# Patient Record
Sex: Female | Born: 1960 | Race: White | Hispanic: No | Marital: Married | Smoking: Never smoker
Health system: Southern US, Community
[De-identification: ages and names within clinical notes are randomized; demographics above are authoritative.]

## PROBLEM LIST (undated history)

## (undated) DIAGNOSIS — K219 Gastro-esophageal reflux disease without esophagitis: Secondary | ICD-10-CM

## (undated) DIAGNOSIS — M1990 Unspecified osteoarthritis, unspecified site: Secondary | ICD-10-CM

## (undated) DIAGNOSIS — E079 Disorder of thyroid, unspecified: Secondary | ICD-10-CM

## (undated) HISTORY — DX: Disorder of thyroid, unspecified: E07.9

## (undated) HISTORY — DX: Unspecified osteoarthritis, unspecified site: M19.90

## (undated) HISTORY — PX: BREAST SURGERY: SHX581

## (undated) HISTORY — DX: Gastro-esophageal reflux disease without esophagitis: K21.9

---

## 2012-11-28 ENCOUNTER — Ambulatory Visit: Payer: Self-pay | Admitting: Internal Medicine

## 2012-11-28 ENCOUNTER — Encounter: Payer: Self-pay | Admitting: Internal Medicine

## 2012-11-28 ENCOUNTER — Ambulatory Visit (INDEPENDENT_AMBULATORY_CARE_PROVIDER_SITE_OTHER): Payer: Managed Care, Other (non HMO) | Admitting: Internal Medicine

## 2012-11-28 VITALS — BP 120/80 | HR 81 | Temp 97.8°F | Ht 66.0 in | Wt 228.0 lb

## 2012-11-28 DIAGNOSIS — Z0000 Encounter for general adult medical examination without abnormal findings: Secondary | ICD-10-CM

## 2012-11-28 DIAGNOSIS — E669 Obesity, unspecified: Secondary | ICD-10-CM

## 2012-11-28 DIAGNOSIS — E039 Hypothyroidism, unspecified: Secondary | ICD-10-CM

## 2012-11-28 DIAGNOSIS — L4050 Arthropathic psoriasis, unspecified: Secondary | ICD-10-CM | POA: Insufficient documentation

## 2012-11-28 LAB — CBC
HCT: 43.4 % (ref 36.0–46.0)
MCHC: 34.4 g/dL (ref 30.0–36.0)
MCV: 92 fl (ref 78.0–100.0)
Platelets: 210 10*3/uL (ref 150.0–400.0)
RBC: 4.72 Mil/uL (ref 3.87–5.11)
RDW: 14.1 % (ref 11.5–14.6)

## 2012-11-28 LAB — COMPREHENSIVE METABOLIC PANEL
ALT: 23 U/L (ref 0–35)
AST: 23 U/L (ref 0–37)
Alkaline Phosphatase: 86 U/L (ref 39–117)
BUN: 13 mg/dL (ref 6–23)
CO2: 27 mEq/L (ref 19–32)
Calcium: 9.3 mg/dL (ref 8.4–10.5)
Chloride: 106 mEq/L (ref 96–112)
Creatinine, Ser: 0.9 mg/dL (ref 0.4–1.2)
Glucose, Bld: 89 mg/dL (ref 70–99)
Potassium: 4.2 mEq/L (ref 3.5–5.1)

## 2012-11-28 LAB — LIPID PANEL
Cholesterol: 189 mg/dL (ref 0–200)
HDL: 38.2 mg/dL — ABNORMAL LOW (ref >39.00)
LDL Cholesterol: 126 mg/dL — ABNORMAL HIGH (ref 0–99)
Total CHOL/HDL Ratio: 5
Triglycerides: 125 mg/dL (ref 0.0–149.0)

## 2012-11-28 LAB — TSH: TSH: 0.74 u[IU]/mL (ref 0.35–5.50)

## 2012-11-28 MED ORDER — FEXOFENADINE HCL 180 MG PO TABS: 180.0000 mg | ORAL_TABLET | Freq: Every day | ORAL | Status: AC

## 2012-11-28 MED ORDER — FLUTICASONE PROPIONATE 50 MCG/ACT NA SUSP: 2.0000 | Freq: Every day | NASAL | Status: DC

## 2012-11-28 MED ORDER — FERROUS SULFATE 325 (65 FE) MG PO TBEC: 325.0000 mg | DELAYED_RELEASE_TABLET | Freq: Every day | ORAL | Status: AC

## 2012-11-28 MED ORDER — LEVOTHYROXINE SODIUM 112 MCG PO TABS: 112.0000 ug | ORAL_TABLET | Freq: Every day | ORAL | Status: DC

## 2012-11-28 MED ORDER — B-12 1000 MCG PO CAPS: 1.0000 | ORAL_CAPSULE | Freq: Every day | ORAL | Status: AC

## 2012-11-28 MED ORDER — CYANOCOBALAMIN 1000 MCG/ML IJ SOLN: 1000.0000 ug | INTRAMUSCULAR | Status: AC

## 2012-11-28 MED ORDER — ERGOCALCIFEROL 1.25 MG (50000 UT) PO CAPS: 50000.0000 [IU] | ORAL_CAPSULE | ORAL | Status: AC

## 2012-11-28 NOTE — Assessment & Plan Note (Signed)
Work on diet and exercise 

## 2012-11-28 NOTE — Assessment & Plan Note (Signed)
Continue Enbrel and continue to follow with rheumatology

## 2012-11-28 NOTE — Patient Instructions (Signed)

## 2012-11-28 NOTE — Progress Notes (Signed)
HPI Pt presents to the clinic today to establish care. She recently moved from [place]. She is transferring care from Dr. Yolanda Manges. Her last physical was 10/2011. She has no concerns today.  Flu: 10/2012 Pneumoniavax: 2000 Tetanus: within last 10 years Pap Smear: 2012 Mammogram: 2012 Eye Doctor: yearly Dentist: biannually  Past Medical History  Diagnosis Date  . Arthritis   . GERD (gastroesophageal reflux disease)   . Thyroid disease     Current Outpatient Prescriptions  Medication Sig Dispense Refill  . cyanocobalamin (,VITAMIN B-12,) 1000 MCG/ML injection Inject 1,000 mcg into the muscle once a week.      . Cyanocobalamin (B-12) 1000 MCG CAPS Take 1 capsule by mouth daily.      . ergocalciferol (VITAMIN D2) 50000 UNITS capsule Take 50,000 Units by mouth 2 (two) times a week.      . etanercept (ENBREL) 50 MG/ML injection Inject 50 mg into the skin once a week.      . ferrous sulfate 325 (65 FE) MG EC tablet Take 325 mg by mouth daily with breakfast.      . fexofenadine (ALLEGRA) 180 MG tablet Take 180 mg by mouth daily.      . fluticasone (FLONASE) 50 MCG/ACT nasal spray Place 2 sprays into both nostrils daily.      Marland Kitchen levothyroxine (SYNTHROID, LEVOTHROID) 112 MCG tablet Take 112 mcg by mouth daily before breakfast.       No current facility-administered medications for this visit.    Allergies  Allergen Reactions  . Sulfa Antibiotics     Family History  Problem Relation Age of Onset  . Hypertension Mother   . COPD Mother   . Birth defects Maternal Aunt     uterine  . Stroke Paternal Uncle     History   Social History  . Marital Status: Married    Spouse Name: N/A    Number of Children: N/A  . Years of Education: N/A   Occupational History  . Not on file.   Social History Main Topics  . Smoking status: Never Smoker   . Smokeless tobacco: Never Used  . Alcohol Use: No  . Drug Use: No  . Sexual Activity: Yes    Birth Control/ Protection: Post-menopausal    Other Topics Concern  . Not on file   Social History Narrative  . No narrative on file    ROS:  Constitutional: Denies fever, malaise, fatigue, headache or abrupt weight changes.  HEENT: Denies eye pain, eye redness, ear pain, ringing in the ears, wax buildup, runny nose, nasal congestion, bloody nose, or sore throat. Respiratory: Denies difficulty breathing, shortness of breath, cough or sputum production.   Cardiovascular: Denies chest pain, chest tightness, palpitations or swelling in the hands or feet.  Gastrointestinal: Denies abdominal pain, bloating, constipation, diarrhea or blood in the stool.  GU: Denies frequency, urgency, pain with urination, blood in urine, odor or discharge. Musculoskeletal: Pt reports multiple joint pain. Denies decrease in range of motion, difficulty with gait, muscle pain Skin: Pt reports psoriasis. Denies redness, lesions or ulcercations.  Neurological: Denies dizziness, difficulty with memory, difficulty with speech or problems with balance and coordination.   No other specific complaints in a complete review of systems (except as listed in HPI above).  PE:  BP 120/80  Pulse 81  Temp(Src) 97.8 F (36.6 C) (Tympanic)  Ht 5\' 6"  (1.676 m)  Wt 228 lb (103.42 kg)  BMI 36.82 kg/m2  SpO2 98% Wt Readings from Last 3 Encounters:  11/28/12 228 lb (103.42 kg)    General: Appears her stated age, obese but well developed, well nourished in NAD. HEENT: Head: normal shape and size; Eyes: sclera white, no icterus, conjunctiva pink, PERRLA and EOMs intact; Ears: Tm's gray and intact, normal light reflex; Nose: mucosa pink and moist, septum midline; Throat/Mouth: Teeth present, mucosa pink and moist, no lesions or ulcerations noted.  Neck: Normal range of motion. Neck supple, trachea midline. No massses, lumps or thyromegaly present.  Cardiovascular: Normal rate and rhythm. S1,S2 noted.  No murmur, rubs or gallops noted. No JVD or BLE edema. No carotid  bruits noted. Pulmonary/Chest: Normal effort and positive vesicular breath sounds. No respiratory distress. No wheezes, rales or ronchi noted.  Abdomen: Soft and nontender. Normal bowel sounds, no bruits noted. No distention or masses noted. Liver, spleen and kidneys non palpable. Musculoskeletal: Normal range of motion. No signs of joint swelling. No difficulty with gait.  Neurological: Alert and oriented. Cranial nerves II-XII intact. Coordination normal. +DTRs bilaterally. Psychiatric: Mood and affect normal. Behavior is normal. Judgment and thought content normal.      Assessment and Plan:  Preventative Health Maintenance:  All HM UTD Will obtain screening labs today Will request records from previous provider Will schedule your mammogram for you RTC for your pap smear whenever is convenient for you.  RTC in 1 year or sooner if needed

## 2012-11-28 NOTE — Assessment & Plan Note (Signed)
Will check TSH today Synthroid refilled today

## 2012-11-29 ENCOUNTER — Encounter: Payer: Self-pay | Admitting: Family Medicine

## 2012-12-11 ENCOUNTER — Ambulatory Visit: Payer: Self-pay | Admitting: Internal Medicine

## 2012-12-25 ENCOUNTER — Encounter: Payer: Self-pay | Admitting: Internal Medicine

## 2012-12-26 ENCOUNTER — Ambulatory Visit: Payer: Self-pay | Admitting: Internal Medicine

## 2012-12-27 ENCOUNTER — Encounter: Payer: Self-pay | Admitting: Internal Medicine

## 2013-01-07 ENCOUNTER — Ambulatory Visit: Payer: Managed Care, Other (non HMO) | Admitting: Internal Medicine

## 2013-05-16 ENCOUNTER — Other Ambulatory Visit: Payer: Self-pay | Admitting: Internal Medicine

## 2013-07-08 ENCOUNTER — Other Ambulatory Visit: Payer: Self-pay | Admitting: Internal Medicine

## 2013-10-07 ENCOUNTER — Other Ambulatory Visit: Payer: Self-pay

## 2013-10-07 MED ORDER — LEVOTHYROXINE SODIUM 112 MCG PO TABS: 112.0000 ug | ORAL_TABLET | Freq: Every day | ORAL | Status: DC

## 2013-12-03 ENCOUNTER — Encounter: Payer: Managed Care, Other (non HMO) | Admitting: Internal Medicine

## 2013-12-05 ENCOUNTER — Ambulatory Visit (INDEPENDENT_AMBULATORY_CARE_PROVIDER_SITE_OTHER)
Admission: RE | Admit: 2013-12-05 | Discharge: 2013-12-05 | Disposition: A | Discharge status: Home / Self Care | Payer: Private Health Insurance - Indemnity | Source: Ambulatory Visit | Attending: Internal Medicine | Admitting: Internal Medicine

## 2013-12-05 ENCOUNTER — Ambulatory Visit (INDEPENDENT_AMBULATORY_CARE_PROVIDER_SITE_OTHER): Payer: Private Health Insurance - Indemnity | Admitting: Internal Medicine

## 2013-12-05 ENCOUNTER — Encounter: Payer: Self-pay | Admitting: Internal Medicine

## 2013-12-05 VITALS — BP 142/84 | HR 88 | Temp 98.0°F | Ht 65.25 in | Wt 234.0 lb

## 2013-12-05 DIAGNOSIS — M545 Low back pain, unspecified: Secondary | ICD-10-CM

## 2013-12-05 DIAGNOSIS — I10 Essential (primary) hypertension: Secondary | ICD-10-CM | POA: Insufficient documentation

## 2013-12-05 DIAGNOSIS — Z0000 Encounter for general adult medical examination without abnormal findings: Secondary | ICD-10-CM

## 2013-12-05 DIAGNOSIS — E669 Obesity, unspecified: Secondary | ICD-10-CM

## 2013-12-05 DIAGNOSIS — R928 Other abnormal and inconclusive findings on diagnostic imaging of breast: Secondary | ICD-10-CM

## 2013-12-05 MED ORDER — HYDROCHLOROTHIAZIDE 12.5 MG PO CAPS: 12.5000 mg | ORAL_CAPSULE | Freq: Every day | ORAL | Status: DC

## 2013-12-05 NOTE — Progress Notes (Signed)
Pre visit review using our clinic review tool, if applicable. No additional management support is needed unless otherwise documented below in the visit note. 

## 2013-12-05 NOTE — Patient Instructions (Signed)
Preventive Care for Adults A healthy lifestyle and preventive care can promote health and wellness. Preventive health guidelines for women include the following key practices.  A routine yearly physical is a good way to check with your health care provider about your health and preventive screening. It is a chance to share any concerns and updates on your health and to receive a thorough exam.  Visit your dentist for a routine exam and preventive care every 6 months. Brush your teeth twice a day and floss once a day. Good oral hygiene prevents tooth decay and gum disease.  The frequency of eye exams is based on your age, health, family medical history, use of contact lenses, and other factors. Follow your health care provider's recommendations for frequency of eye exams.  Eat a healthy diet. Foods like vegetables, fruits, whole grains, low-fat dairy products, and lean protein foods contain the nutrients you need without too many calories. Decrease your intake of foods high in solid fats, added sugars, and salt. Eat the right amount of calories for you.Get information about a proper diet from your health care provider, if necessary.  Regular physical exercise is one of the most important things you can do for your health. Most adults should get at least 150 minutes of moderate-intensity exercise (any activity that increases your heart rate and causes you to sweat) each week. In addition, most adults need muscle-strengthening exercises on 2 or more days a week.  Maintain a healthy weight. The body mass index (BMI) is a screening tool to identify possible weight problems. It provides an estimate of body fat based on height and weight. Your health care provider can find your BMI and can help you achieve or maintain a healthy weight.For adults 20 years and older:  A BMI below 18.5 is considered underweight.  A BMI of 18.5 to 24.9 is normal.  A BMI of 25 to 29.9 is considered overweight.  A BMI of  30 and above is considered obese.  Maintain normal blood lipids and cholesterol levels by exercising and minimizing your intake of saturated fat. Eat a balanced diet with plenty of fruit and vegetables. Blood tests for lipids and cholesterol should begin at age 76 and be repeated every 5 years. If your lipid or cholesterol levels are high, you are over 50, or you are at high risk for heart disease, you may need your cholesterol levels checked more frequently.Ongoing high lipid and cholesterol levels should be treated with medicines if diet and exercise are not working.  If you smoke, find out from your health care provider how to quit. If you do not use tobacco, do not start.  Lung cancer screening is recommended for adults aged 22-80 years who are at high risk for developing lung cancer because of a history of smoking. A yearly low-dose CT scan of the lungs is recommended for people who have at least a 30-pack-year history of smoking and are a current smoker or have quit within the past 15 years. A pack year of smoking is smoking an average of 1 pack of cigarettes a day for 1 year (for example: 1 pack a day for 30 years or 2 packs a day for 15 years). Yearly screening should continue until the smoker has stopped smoking for at least 15 years. Yearly screening should be stopped for people who develop a health problem that would prevent them from having lung cancer treatment.  If you are pregnant, do not drink alcohol. If you are breastfeeding,  be very cautious about drinking alcohol. If you are not pregnant and choose to drink alcohol, do not have more than 1 drink per day. One drink is considered to be 12 ounces (355 mL) of beer, 5 ounces (148 mL) of wine, or 1.5 ounces (44 mL) of liquor.  Avoid use of street drugs. Do not share needles with anyone. Ask for help if you need support or instructions about stopping the use of drugs.  High blood pressure causes heart disease and increases the risk of  stroke. Your blood pressure should be checked at least every 1 to 2 years. Ongoing high blood pressure should be treated with medicines if weight loss and exercise do not work.  If you are 3-86 years old, ask your health care provider if you should take aspirin to prevent strokes.  Diabetes screening involves taking a blood sample to check your fasting blood sugar level. This should be done once every 3 years, after age 67, if you are within normal weight and without risk factors for diabetes. Testing should be considered at a younger age or be carried out more frequently if you are overweight and have at least 1 risk factor for diabetes.  Breast cancer screening is essential preventive care for women. You should practice "breast self-awareness." This means understanding the normal appearance and feel of your breasts and may include breast self-examination. Any changes detected, no matter how small, should be reported to a health care provider. Women in their 8s and 30s should have a clinical breast exam (CBE) by a health care provider as part of a regular health exam every 1 to 3 years. After age 70, women should have a CBE every year. Starting at age 25, women should consider having a mammogram (breast X-ray test) every year. Women who have a family history of breast cancer should talk to their health care provider about genetic screening. Women at a high risk of breast cancer should talk to their health care providers about having an MRI and a mammogram every year.  Breast cancer gene (BRCA)-related cancer risk assessment is recommended for women who have family members with BRCA-related cancers. BRCA-related cancers include breast, ovarian, tubal, and peritoneal cancers. Having family members with these cancers may be associated with an increased risk for harmful changes (mutations) in the breast cancer genes BRCA1 and BRCA2. Results of the assessment will determine the need for genetic counseling and  BRCA1 and BRCA2 testing.  Routine pelvic exams to screen for cancer are no longer recommended for nonpregnant women who are considered low risk for cancer of the pelvic organs (ovaries, uterus, and vagina) and who do not have symptoms. Ask your health care provider if a screening pelvic exam is right for you.  If you have had past treatment for cervical cancer or a condition that could lead to cancer, you need Pap tests and screening for cancer for at least 20 years after your treatment. If Pap tests have been discontinued, your risk factors (such as having a new sexual partner) need to be reassessed to determine if screening should be resumed. Some women have medical problems that increase the chance of getting cervical cancer. In these cases, your health care provider may recommend more frequent screening and Pap tests.  The HPV test is an additional test that may be used for cervical cancer screening. The HPV test looks for the virus that can cause the cell changes on the cervix. The cells collected during the Pap test can be  tested for HPV. The HPV test could be used to screen women aged 30 years and older, and should be used in women of any age who have unclear Pap test results. After the age of 30, women should have HPV testing at the same frequency as a Pap test.  Colorectal cancer can be detected and often prevented. Most routine colorectal cancer screening begins at the age of 50 years and continues through age 75 years. However, your health care provider may recommend screening at an earlier age if you have risk factors for colon cancer. On a yearly basis, your health care provider may provide home test kits to check for hidden blood in the stool. Use of a small camera at the end of a tube, to directly examine the colon (sigmoidoscopy or colonoscopy), can detect the earliest forms of colorectal cancer. Talk to your health care provider about this at age 50, when routine screening begins. Direct  exam of the colon should be repeated every 5-10 years through age 75 years, unless early forms of pre-cancerous polyps or small growths are found.  People who are at an increased risk for hepatitis B should be screened for this virus. You are considered at high risk for hepatitis B if:  You were born in a country where hepatitis B occurs often. Talk with your health care provider about which countries are considered high risk.  Your parents were born in a high-risk country and you have not received a shot to protect against hepatitis B (hepatitis B vaccine).  You have HIV or AIDS.  You use needles to inject street drugs.  You live with, or have sex with, someone who has hepatitis B.  You get hemodialysis treatment.  You take certain medicines for conditions like cancer, organ transplantation, and autoimmune conditions.  Hepatitis C blood testing is recommended for all people born from 1945 through 1965 and any individual with known risks for hepatitis C.  Practice safe sex. Use condoms and avoid high-risk sexual practices to reduce the spread of sexually transmitted infections (STIs). STIs include gonorrhea, chlamydia, syphilis, trichomonas, herpes, HPV, and human immunodeficiency virus (HIV). Herpes, HIV, and HPV are viral illnesses that have no cure. They can result in disability, cancer, and death.  You should be screened for sexually transmitted illnesses (STIs) including gonorrhea and chlamydia if:  You are sexually active and are younger than 24 years.  You are older than 24 years and your health care provider tells you that you are at risk for this type of infection.  Your sexual activity has changed since you were last screened and you are at an increased risk for chlamydia or gonorrhea. Ask your health care provider if you are at risk.  If you are at risk of being infected with HIV, it is recommended that you take a prescription medicine daily to prevent HIV infection. This is  called preexposure prophylaxis (PrEP). You are considered at risk if:  You are a heterosexual woman, are sexually active, and are at increased risk for HIV infection.  You take drugs by injection.  You are sexually active with a partner who has HIV.  Talk with your health care provider about whether you are at high risk of being infected with HIV. If you choose to begin PrEP, you should first be tested for HIV. You should then be tested every 3 months for as long as you are taking PrEP.  Osteoporosis is a disease in which the bones lose minerals and strength   with aging. This can result in serious bone fractures or breaks. The risk of osteoporosis can be identified using a bone density scan. Women ages 65 years and over and women at risk for fractures or osteoporosis should discuss screening with their health care providers. Ask your health care provider whether you should take a calcium supplement or vitamin D to reduce the rate of osteoporosis.  Menopause can be associated with physical symptoms and risks. Hormone replacement therapy is available to decrease symptoms and risks. You should talk to your health care provider about whether hormone replacement therapy is right for you.  Use sunscreen. Apply sunscreen liberally and repeatedly throughout the day. You should seek shade when your shadow is shorter than you. Protect yourself by wearing long sleeves, pants, a wide-brimmed hat, and sunglasses year round, whenever you are outdoors.  Once a month, do a whole body skin exam, using a mirror to look at the skin on your back. Tell your health care provider of new moles, moles that have irregular borders, moles that are larger than a pencil eraser, or moles that have changed in shape or color.  Stay current with required vaccines (immunizations).  Influenza vaccine. All adults should be immunized every year.  Tetanus, diphtheria, and acellular pertussis (Td, Tdap) vaccine. Pregnant women should  receive 1 dose of Tdap vaccine during each pregnancy. The dose should be obtained regardless of the length of time since the last dose. Immunization is preferred during the 27th-36th week of gestation. An adult who has not previously received Tdap or who does not know her vaccine status should receive 1 dose of Tdap. This initial dose should be followed by tetanus and diphtheria toxoids (Td) booster doses every 10 years. Adults with an unknown or incomplete history of completing a 3-dose immunization series with Td-containing vaccines should begin or complete a primary immunization series including a Tdap dose. Adults should receive a Td booster every 10 years.  Varicella vaccine. An adult without evidence of immunity to varicella should receive 2 doses or a second dose if she has previously received 1 dose. Pregnant females who do not have evidence of immunity should receive the first dose after pregnancy. This first dose should be obtained before leaving the health care facility. The second dose should be obtained 4-8 weeks after the first dose.  Human papillomavirus (HPV) vaccine. Females aged 13-26 years who have not received the vaccine previously should obtain the 3-dose series. The vaccine is not recommended for use in pregnant females. However, pregnancy testing is not needed before receiving a dose. If a female is found to be pregnant after receiving a dose, no treatment is needed. In that case, the remaining doses should be delayed until after the pregnancy. Immunization is recommended for any person with an immunocompromised condition through the age of 26 years if she did not get any or all doses earlier. During the 3-dose series, the second dose should be obtained 4-8 weeks after the first dose. The third dose should be obtained 24 weeks after the first dose and 16 weeks after the second dose.  Zoster vaccine. One dose is recommended for adults aged 60 years or older unless certain conditions are  present.  Measles, mumps, and rubella (MMR) vaccine. Adults born before 1957 generally are considered immune to measles and mumps. Adults born in 1957 or later should have 1 or more doses of MMR vaccine unless there is a contraindication to the vaccine or there is laboratory evidence of immunity to   each of the three diseases. A routine second dose of MMR vaccine should be obtained at least 28 days after the first dose for students attending postsecondary schools, health care workers, or international travelers. People who received inactivated measles vaccine or an unknown type of measles vaccine during 1963-1967 should receive 2 doses of MMR vaccine. People who received inactivated mumps vaccine or an unknown type of mumps vaccine before 1979 and are at high risk for mumps infection should consider immunization with 2 doses of MMR vaccine. For females of childbearing age, rubella immunity should be determined. If there is no evidence of immunity, females who are not pregnant should be vaccinated. If there is no evidence of immunity, females who are pregnant should delay immunization until after pregnancy. Unvaccinated health care workers born before 1957 who lack laboratory evidence of measles, mumps, or rubella immunity or laboratory confirmation of disease should consider measles and mumps immunization with 2 doses of MMR vaccine or rubella immunization with 1 dose of MMR vaccine.  Pneumococcal 13-valent conjugate (PCV13) vaccine. When indicated, a person who is uncertain of her immunization history and has no record of immunization should receive the PCV13 vaccine. An adult aged 19 years or older who has certain medical conditions and has not been previously immunized should receive 1 dose of PCV13 vaccine. This PCV13 should be followed with a dose of pneumococcal polysaccharide (PPSV23) vaccine. The PPSV23 vaccine dose should be obtained at least 8 weeks after the dose of PCV13 vaccine. An adult aged 19  years or older who has certain medical conditions and previously received 1 or more doses of PPSV23 vaccine should receive 1 dose of PCV13. The PCV13 vaccine dose should be obtained 1 or more years after the last PPSV23 vaccine dose.  Pneumococcal polysaccharide (PPSV23) vaccine. When PCV13 is also indicated, PCV13 should be obtained first. All adults aged 65 years and older should be immunized. An adult younger than age 65 years who has certain medical conditions should be immunized. Any person who resides in a nursing home or long-term care facility should be immunized. An adult smoker should be immunized. People with an immunocompromised condition and certain other conditions should receive both PCV13 and PPSV23 vaccines. People with human immunodeficiency virus (HIV) infection should be immunized as soon as possible after diagnosis. Immunization during chemotherapy or radiation therapy should be avoided. Routine use of PPSV23 vaccine is not recommended for American Indians, [place] Natives, or people younger than 65 years unless there are medical conditions that require PPSV23 vaccine. When indicated, people who have unknown immunization and have no record of immunization should receive PPSV23 vaccine. One-time revaccination 5 years after the first dose of PPSV23 is recommended for people aged 19-64 years who have chronic kidney failure, nephrotic syndrome, asplenia, or immunocompromised conditions. People who received 1-2 doses of PPSV23 before age 65 years should receive another dose of PPSV23 vaccine at age 65 years or later if at least 5 years have passed since the previous dose. Doses of PPSV23 are not needed for people immunized with PPSV23 at or after age 65 years.  Meningococcal vaccine. Adults with asplenia or persistent complement component deficiencies should receive 2 doses of quadrivalent meningococcal conjugate (MenACWY-D) vaccine. The doses should be obtained at least 2 months apart.  Microbiologists working with certain meningococcal bacteria, military recruits, people at risk during an outbreak, and people who travel to or live in countries with a high rate of meningitis should be immunized. A first-year college student up through age   21 years who is living in a residence hall should receive a dose if she did not receive a dose on or after her 16th birthday. Adults who have certain high-risk conditions should receive one or more doses of vaccine.  Hepatitis A vaccine. Adults who wish to be protected from this disease, have certain high-risk conditions, work with hepatitis A-infected animals, work in hepatitis A research labs, or travel to or work in countries with a high rate of hepatitis A should be immunized. Adults who were previously unvaccinated and who anticipate close contact with an international adoptee during the first 60 days after arrival in the [place] from a country with a high rate of hepatitis A should be immunized.  Hepatitis B vaccine. Adults who wish to be protected from this disease, have certain high-risk conditions, may be exposed to blood or other infectious body fluids, are household contacts or sex partners of hepatitis B positive people, are clients or workers in certain care facilities, or travel to or work in countries with a high rate of hepatitis B should be immunized.  Haemophilus influenzae type b (Hib) vaccine. A previously unvaccinated person with asplenia or sickle cell disease or having a scheduled splenectomy should receive 1 dose of Hib vaccine. Regardless of previous immunization, a recipient of a hematopoietic stem cell transplant should receive a 3-dose series 6-12 months after her successful transplant. Hib vaccine is not recommended for adults with HIV infection. Preventive Services / Frequency Ages 64 to 68 years  Blood pressure check.** / Every 1 to 2 years.  Lipid and cholesterol check.** / Every 5 years beginning at age  22.  Clinical breast exam.** / Every 3 years for women in their 88s and 53s.  BRCA-related cancer risk assessment.** / For women who have family members with a BRCA-related cancer (breast, ovarian, tubal, or peritoneal cancers).  Pap test.** / Every 2 years from ages 90 through 51. Every 3 years starting at age 21 through age 56 or 3 with a history of 3 consecutive normal Pap tests.  HPV screening.** / Every 3 years from ages 24 through ages 1 to 46 with a history of 3 consecutive normal Pap tests.  Hepatitis C blood test.** / For any individual with known risks for hepatitis C.  Skin self-exam. / Monthly.  Influenza vaccine. / Every year.  Tetanus, diphtheria, and acellular pertussis (Tdap, Td) vaccine.** / Consult your health care provider. Pregnant women should receive 1 dose of Tdap vaccine during each pregnancy. 1 dose of Td every 10 years.  Varicella vaccine.** / Consult your health care provider. Pregnant females who do not have evidence of immunity should receive the first dose after pregnancy.  HPV vaccine. / 3 doses over 6 months, if 72 and younger. The vaccine is not recommended for use in pregnant females. However, pregnancy testing is not needed before receiving a dose.  Measles, mumps, rubella (MMR) vaccine.** / You need at least 1 dose of MMR if you were born in 1957 or later. You may also need a 2nd dose. For females of childbearing age, rubella immunity should be determined. If there is no evidence of immunity, females who are not pregnant should be vaccinated. If there is no evidence of immunity, females who are pregnant should delay immunization until after pregnancy.  Pneumococcal 13-valent conjugate (PCV13) vaccine.** / Consult your health care provider.  Pneumococcal polysaccharide (PPSV23) vaccine.** / 1 to 2 doses if you smoke cigarettes or if you have certain conditions.  Meningococcal vaccine.** /  1 dose if you are age 19 to 21 years and a first-year college  student living in a residence hall, or have one of several medical conditions, you need to get vaccinated against meningococcal disease. You may also need additional booster doses.  Hepatitis A vaccine.** / Consult your health care provider.  Hepatitis B vaccine.** / Consult your health care provider.  Haemophilus influenzae type b (Hib) vaccine.** / Consult your health care provider. Ages 40 to 64 years  Blood pressure check.** / Every 1 to 2 years.  Lipid and cholesterol check.** / Every 5 years beginning at age 20 years.  Lung cancer screening. / Every year if you are aged 55-80 years and have a 30-pack-year history of smoking and currently smoke or have quit within the past 15 years. Yearly screening is stopped once you have quit smoking for at least 15 years or develop a health problem that would prevent you from having lung cancer treatment.  Clinical breast exam.** / Every year after age 40 years.  BRCA-related cancer risk assessment.** / For women who have family members with a BRCA-related cancer (breast, ovarian, tubal, or peritoneal cancers).  Mammogram.** / Every year beginning at age 40 years and continuing for as long as you are in good health. Consult with your health care provider.  Pap test.** / Every 3 years starting at age 30 years through age 65 or 70 years with a history of 3 consecutive normal Pap tests.  HPV screening.** / Every 3 years from ages 30 years through ages 65 to 70 years with a history of 3 consecutive normal Pap tests.  Fecal occult blood test (FOBT) of stool. / Every year beginning at age 50 years and continuing until age 75 years. You may not need to do this test if you get a colonoscopy every 10 years.  Flexible sigmoidoscopy or colonoscopy.** / Every 5 years for a flexible sigmoidoscopy or every 10 years for a colonoscopy beginning at age 50 years and continuing until age 75 years.  Hepatitis C blood test.** / For all people born from 1945 through  1965 and any individual with known risks for hepatitis C.  Skin self-exam. / Monthly.  Influenza vaccine. / Every year.  Tetanus, diphtheria, and acellular pertussis (Tdap/Td) vaccine.** / Consult your health care provider. Pregnant women should receive 1 dose of Tdap vaccine during each pregnancy. 1 dose of Td every 10 years.  Varicella vaccine.** / Consult your health care provider. Pregnant females who do not have evidence of immunity should receive the first dose after pregnancy.  Zoster vaccine.** / 1 dose for adults aged 60 years or older.  Measles, mumps, rubella (MMR) vaccine.** / You need at least 1 dose of MMR if you were born in 1957 or later. You may also need a 2nd dose. For females of childbearing age, rubella immunity should be determined. If there is no evidence of immunity, females who are not pregnant should be vaccinated. If there is no evidence of immunity, females who are pregnant should delay immunization until after pregnancy.  Pneumococcal 13-valent conjugate (PCV13) vaccine.** / Consult your health care provider.  Pneumococcal polysaccharide (PPSV23) vaccine.** / 1 to 2 doses if you smoke cigarettes or if you have certain conditions.  Meningococcal vaccine.** / Consult your health care provider.  Hepatitis A vaccine.** / Consult your health care provider.  Hepatitis B vaccine.** / Consult your health care provider.  Haemophilus influenzae type b (Hib) vaccine.** / Consult your health care provider. Ages 65   years and over  Blood pressure check.** / Every 1 to 2 years.  Lipid and cholesterol check.** / Every 5 years beginning at age 22 years.  Lung cancer screening. / Every year if you are aged 73-80 years and have a 30-pack-year history of smoking and currently smoke or have quit within the past 15 years. Yearly screening is stopped once you have quit smoking for at least 15 years or develop a health problem that would prevent you from having lung cancer  treatment.  Clinical breast exam.** / Every year after age 4 years.  BRCA-related cancer risk assessment.** / For women who have family members with a BRCA-related cancer (breast, ovarian, tubal, or peritoneal cancers).  Mammogram.** / Every year beginning at age 40 years and continuing for as long as you are in good health. Consult with your health care provider.  Pap test.** / Every 3 years starting at age 9 years through age 34 or 91 years with 3 consecutive normal Pap tests. Testing can be stopped between 65 and 70 years with 3 consecutive normal Pap tests and no abnormal Pap or HPV tests in the past 10 years.  HPV screening.** / Every 3 years from ages 57 years through ages 64 or 45 years with a history of 3 consecutive normal Pap tests. Testing can be stopped between 65 and 70 years with 3 consecutive normal Pap tests and no abnormal Pap or HPV tests in the past 10 years.  Fecal occult blood test (FOBT) of stool. / Every year beginning at age 15 years and continuing until age 17 years. You may not need to do this test if you get a colonoscopy every 10 years.  Flexible sigmoidoscopy or colonoscopy.** / Every 5 years for a flexible sigmoidoscopy or every 10 years for a colonoscopy beginning at age 86 years and continuing until age 71 years.  Hepatitis C blood test.** / For all people born from [birth year] through 1964 and any individual with known risks for hepatitis C.  Osteoporosis screening.** / A one-time screening for women ages 83 years and over and women at risk for fractures or osteoporosis.  Skin self-exam. / Monthly.  Influenza vaccine. / Every year.  Tetanus, diphtheria, and acellular pertussis (Tdap/Td) vaccine.** / 1 dose of Td every 10 years.  Varicella vaccine.** / Consult your health care provider.  Zoster vaccine.** / 1 dose for adults aged 61 years or older.  Pneumococcal 13-valent conjugate (PCV13) vaccine.** / Consult your health care provider.  Pneumococcal  polysaccharide (PPSV23) vaccine.** / 1 dose for all adults aged 28 years and older.  Meningococcal vaccine.** / Consult your health care provider.  Hepatitis A vaccine.** / Consult your health care provider.  Hepatitis B vaccine.** / Consult your health care provider.  Haemophilus influenzae type b (Hib) vaccine.** / Consult your health care provider. ** Family history and personal history of risk and conditions may change your health care provider's recommendations. Document Released: 03/07/2001 Document Revised: 05/26/2013 Document Reviewed: 06/06/2010 [facility] Patient Information 2015 [place], [place]. This information is not intended to replace advice given to you by your health care provider. Make sure you discuss any questions you have with your health care provider.

## 2013-12-05 NOTE — Progress Notes (Signed)
Subjective:    Patient ID: Molly Cummings, female    DOB: 08-25-1960, 53 y.o.   MRN: [id number]  HPI  Pt presents to the clinic today to for her annual physical.  She does reports having some blood pressure issues. The highest it has been in 156/96. She reports that it normal runs 140/90's. BP today is 142/84, but she reports she is asymptomatic.  She reports that she is past due for her 6 month follow up from her mammogram and ultrasound. She request a new order today. She has a few nodular densities in the left breast that are being observed.  She has gained 6 lbs. She does not consume and specific diet and does not exercise.  Flu: yearly, wants one today Tetanus: < 10 years ago Pap Smear:  2013 Mammogram: 04/2013 Colon Screening: 2008 Vision Screening: 2015 Dentist: bianually  Review of Systems      Past Medical History  Diagnosis Date  . Arthritis   . GERD (gastroesophageal reflux disease)   . Thyroid disease     Current Outpatient Prescriptions  Medication Sig Dispense Refill  . cyanocobalamin (,VITAMIN B-12,) 1000 MCG/ML injection Inject 1 mL (1,000 mcg total) into the muscle once a week. 30 mL 1  . Cyanocobalamin (B-12) 1000 MCG CAPS Take 1 capsule by mouth daily. 90 capsule 3  . ergocalciferol (VITAMIN D2) 50000 UNITS capsule Take 1 capsule (50,000 Units total) by mouth 2 (two) times a week. 4 capsule 4  . etanercept (ENBREL) 50 MG/ML injection Inject 50 mg into the skin once a week.    . ferrous sulfate 325 (65 FE) MG EC tablet Take 1 tablet (325 mg total) by mouth daily with breakfast. 90 tablet 3  . fexofenadine (ALLEGRA) 180 MG tablet Take 1 tablet (180 mg total) by mouth daily. 90 tablet 3  . fluticasone (FLONASE) 50 MCG/ACT nasal spray USE 2 SPRAYS IN EACH NOSTRIL ONCE A DAY 16 g 4  . levothyroxine (SYNTHROID, LEVOTHROID) 112 MCG tablet Take 1 tablet (112 mcg total) by mouth daily before breakfast. 90 tablet 0   No current facility-administered medications for  this visit.    Allergies  Allergen Reactions  . Sulfa Antibiotics     Family History  Problem Relation Age of Onset  . Hypertension Mother   . COPD Mother   . Birth defects Maternal Aunt     uterine  . Stroke Paternal Uncle     History   Social History  . Marital Status: Married    Spouse Name: N/A    Number of Children: N/A  . Years of Education: N/A   Occupational History  . Not on file.   Social History Main Topics  . Smoking status: Never Smoker   . Smokeless tobacco: Never Used  . Alcohol Use: No  . Drug Use: No  . Sexual Activity: Yes    Birth Control/ Protection: Post-menopausal   Other Topics Concern  . Not on file   Social History Narrative     Constitutional: Pt reports fatigue. Denies fever, malaise, headache or abrupt weight changes.  HEENT: Denies eye pain, eye redness, ear pain, ringing in the ears, wax buildup, runny nose, nasal congestion, bloody nose, or sore throat. Respiratory: Denies difficulty breathing, shortness of breath, cough or sputum production.   Cardiovascular: Denies chest pain, chest tightness, palpitations or swelling in the hands or feet.  Gastrointestinal: Denies abdominal pain, bloating, constipation, diarrhea or blood in the stool.  GU: Denies urgency, frequency,  pain with urination, burning sensation, blood in urine, odor or discharge. Musculoskeletal: Pt reports low back pain. Denies difficulty with gait, muscle pain or joint  swelling.  Skin: Pt reports psoriasis. Denies redness, lesions or ulcercations.  Neurological: Denies dizziness, difficulty with memory, difficulty with speech or problems with balance and coordination.   No other specific complaints in a complete review of systems (except as listed in HPI above).  Objective:   Physical Exam   BP 142/84 mmHg  Pulse 88  Temp(Src) 98 F (36.7 C) (Oral)  Ht 5' 5.25" (1.657 m)  Wt 234 lb (106.142 kg)  BMI 38.66 kg/m2  SpO2 98% Wt Readings from Last 3  Encounters:  12/05/13 234 lb (106.142 kg)  11/28/12 228 lb (103.42 kg)    General: Appears her stated age, obese but well developed, well nourished in NAD. Skin: Warm, dry and intact. Psoriasis noted on elbows. HEENT: Head: normal shape and size; Eyes: sclera white, no icterus, conjunctiva pink, PERRLA and EOMs intact; Ears: Tm's gray and intact, normal light reflex; Nose: mucosa pink and moist, septum midline; Throat/Mouth: Teeth present, mucosa pink and moist, no exudate, lesions or ulcerations noted.  Neck: Neck supple, trachea midline. No masses, lumps or thyromegaly present.  Cardiovascular: Normal rate and rhythm. S1,S2 noted.  No murmur, rubs or gallops noted. No JVD or BLE edema. No carotid bruits noted. Pulmonary/Chest: Normal effort and positive vesicular breath sounds. No respiratory distress. No wheezes, rales or ronchi noted.  Abdomen: Soft and nontender. Normal bowel sounds, no bruits noted. No distention or masses noted. Liver, spleen and kidneys non palpable. Musculoskeletal: Decreased flexion of the lumbar spine. Normal extension and rotation. Tender to palpation over the lumbar spine.  No difficulty with gait. Strength 5/5 BLE. Neurological: Alert and oriented. Cranial nerves II-XII grossly intact. Coordination normal.  Psychiatric: Mood and affect normal. Behavior is normal. Judgment and thought content normal.     BMET    Component Value Date/Time   NA 139 11/28/2012 1123   K 4.2 11/28/2012 1123   CL 106 11/28/2012 1123   CO2 27 11/28/2012 1123   GLUCOSE 89 11/28/2012 1123   BUN 13 11/28/2012 1123   CREATININE 0.9 11/28/2012 1123   CALCIUM 9.3 11/28/2012 1123    Lipid Panel     Component Value Date/Time   CHOL 189 11/28/2012 1123   TRIG 125.0 11/28/2012 1123   HDL 38.20* 11/28/2012 1123   CHOLHDL 5 11/28/2012 1123   VLDL 25.0 11/28/2012 1123   LDLCALC 126* 11/28/2012 1123    CBC    Component Value Date/Time   WBC 6.9 11/28/2012 1123   RBC 4.72  11/28/2012 1123   HGB 14.9 11/28/2012 1123   HCT 43.4 11/28/2012 1123   PLT 210.0 11/28/2012 1123   MCV 92.0 11/28/2012 1123   MCHC 34.4 11/28/2012 1123   RDW 14.1 11/28/2012 1123    Hgb A1C No results found for: HGBA1C      Assessment & Plan:   Preventative Health Maintenance:  Will order repeat diagnostic mammogram with left breast ultrasound- set up with [facility] on your way out Flu shot today Pap smear due 2018 Colon Screening due 2018 Will check CBC, CMET, TSH, T4, Lipid and B12 today  Low back pain:  Will check xray of lumbar spine Try a heating pad and Aleve for now Stretching may also be helpful

## 2013-12-05 NOTE — Assessment & Plan Note (Signed)
Will start HCTZ  RTC in 1 month to recheck BP

## 2013-12-05 NOTE — Assessment & Plan Note (Signed)
Encouraged her to work on diet and exercise 

## 2013-12-06 LAB — VITAMIN B12: Vitamin B-12: >2000 pg/mL — ABNORMAL HIGH (ref 211–911)

## 2013-12-06 LAB — COMPREHENSIVE METABOLIC PANEL
ALT: 27 U/L (ref 0–35)
AST: 23 U/L (ref 0–37)
Albumin: 3.9 g/dL (ref 3.5–5.2)
Alkaline Phosphatase: 81 U/L (ref 39–117)
BILIRUBIN TOTAL: 0.6 mg/dL (ref 0.2–1.2)
BUN: 11 mg/dL (ref 6–23)
CHLORIDE: 104 meq/L (ref 96–112)
CO2: 24 mEq/L (ref 19–32)
CREATININE: 0.99 mg/dL (ref 0.50–1.10)
Calcium: 9 mg/dL (ref 8.4–10.5)
GLUCOSE: 97 mg/dL (ref 70–99)
Potassium: 3.9 mEq/L (ref 3.5–5.3)
Sodium: 140 mEq/L (ref 135–145)
TOTAL PROTEIN: 7.4 g/dL (ref 6.0–8.3)

## 2013-12-06 LAB — CBC
HCT: 42.9 % (ref 36.0–46.0)
HEMOGLOBIN: 15.1 g/dL — AB (ref 12.0–15.0)
MCH: 31.5 pg (ref 26.0–34.0)
MCHC: 35.2 g/dL (ref 30.0–36.0)
MCV: 89.6 fL (ref 78.0–100.0)
Platelets: 219 10*3/uL (ref 150–400)
RBC: 4.79 MIL/uL (ref 3.87–5.11)
RDW: 14 % (ref 11.5–15.5)
WBC: 7.5 10*3/uL (ref 4.0–10.5)

## 2013-12-06 LAB — LIPID PANEL
Cholesterol: 176 mg/dL (ref 0–200)
HDL: 44 mg/dL (ref >39)
LDL CALC: 115 mg/dL — AB (ref 0–99)
TRIGLYCERIDES: 85 mg/dL (ref <150)
Total CHOL/HDL Ratio: 4 Ratio
VLDL: 17 mg/dL (ref 0–40)

## 2013-12-06 LAB — TSH: TSH: 1.927 u[IU]/mL (ref 0.350–4.500)

## 2013-12-06 LAB — T4, FREE: FREE T4: 1.26 ng/dL (ref 0.80–1.80)

## 2013-12-15 ENCOUNTER — Telehealth: Payer: Self-pay | Admitting: Internal Medicine

## 2013-12-15 NOTE — Telephone Encounter (Signed)
It does not completely absorb between the time she gets her injection and 10 minutes later when she has her lab drawn. It is still way to high. She should stop  B12, we can recheck levels in 1 month, if low can restart

## 2013-12-15 NOTE — Telephone Encounter (Signed)
Pt wanted to let you know her b-12 levels were really high b/c she just received her b-12 vaccine

## 2013-12-16 ENCOUNTER — Telehealth: Payer: Self-pay | Admitting: Internal Medicine

## 2013-12-16 ENCOUNTER — Other Ambulatory Visit: Payer: Self-pay | Admitting: Internal Medicine

## 2013-12-16 DIAGNOSIS — R921 Mammographic calcification found on diagnostic imaging of breast: Secondary | ICD-10-CM

## 2013-12-16 NOTE — Telephone Encounter (Signed)
done

## 2013-12-16 NOTE — Telephone Encounter (Signed)
Please put order for R breast US with dx of calcifications. Pt never returned to have this done from her 12/2012 MM. See report in your inbox.

## 2013-12-16 NOTE — Telephone Encounter (Signed)
Pt is aware as instructed and expressed understanding with stopping B12

## 2014-01-01 ENCOUNTER — Ambulatory Visit: Payer: Self-pay | Admitting: Internal Medicine

## 2014-01-01 ENCOUNTER — Encounter: Payer: Self-pay | Admitting: Internal Medicine

## 2014-01-09 ENCOUNTER — Ambulatory Visit (INDEPENDENT_AMBULATORY_CARE_PROVIDER_SITE_OTHER): Payer: Private Health Insurance - Indemnity | Admitting: Internal Medicine

## 2014-01-09 ENCOUNTER — Encounter: Payer: Self-pay | Admitting: Internal Medicine

## 2014-01-09 VITALS — BP 126/88 | HR 98 | Temp 97.8°F | Wt 233.0 lb

## 2014-01-09 DIAGNOSIS — E039 Hypothyroidism, unspecified: Secondary | ICD-10-CM

## 2014-01-09 DIAGNOSIS — R748 Abnormal levels of other serum enzymes: Secondary | ICD-10-CM

## 2014-01-09 DIAGNOSIS — I10 Essential (primary) hypertension: Secondary | ICD-10-CM

## 2014-01-09 LAB — BASIC METABOLIC PANEL
BUN: 12 mg/dL (ref 6–23)
CALCIUM: 9.4 mg/dL (ref 8.4–10.5)
CO2: 28 mEq/L (ref 19–32)
Chloride: 102 mEq/L (ref 96–112)
Creat: 0.99 mg/dL (ref 0.50–1.10)
Glucose, Bld: 110 mg/dL — ABNORMAL HIGH (ref 70–99)
POTASSIUM: 3.6 meq/L (ref 3.5–5.3)
Sodium: 140 mEq/L (ref 135–145)

## 2014-01-09 LAB — VITAMIN B12: Vitamin B-12: 352 pg/mL (ref 211–911)

## 2014-01-09 MED ORDER — LEVOTHYROXINE SODIUM 112 MCG PO TABS: 112.0000 ug | ORAL_TABLET | Freq: Every day | ORAL | Status: DC

## 2014-01-09 MED ORDER — HYDROCHLOROTHIAZIDE 12.5 MG PO CAPS: 12.5000 mg | ORAL_CAPSULE | Freq: Every day | ORAL | Status: DC

## 2014-01-09 NOTE — Progress Notes (Signed)
Subjective:    Patient ID: Molly Cummings, female    DOB: [birth year], 53 y.o.   MRN: [id number]  HPI  Pt presents to the clinic today for 1 month follow up of HTN. Her BP was elevated at her last visit at 142/84. She was started on HCTZ 12.5 mg daily. She reports she has been taking the medication as directed. She did feel a little dizzy in the beginning but reports that did subside. She has not checked her BP since her last visit. BP today is 126/88  Additionally, she needs her B12 rechecked. Her level was > 2000 at her last visit, we stopped her b12. She has felt a little more fatigued since that time.  She needs a refill of her synthroid. Labs were drawn 11/2013.  Review of Systems      Past Medical History  Diagnosis Date  . Arthritis   . GERD (gastroesophageal reflux disease)   . Thyroid disease     Current Outpatient Prescriptions  Medication Sig Dispense Refill  . cyanocobalamin (,VITAMIN B-12,) 1000 MCG/ML injection Inject 1 mL (1,000 mcg total) into the muscle once a week. 30 mL 1  . Cyanocobalamin (B-12) 1000 MCG CAPS Take 1 capsule by mouth daily. 90 capsule 3  . ergocalciferol (VITAMIN D2) 50000 UNITS capsule Take 1 capsule (50,000 Units total) by mouth 2 (two) times a week. 4 capsule 4  . etanercept (ENBREL) 50 MG/ML injection Inject 50 mg into the skin once a week.    . ferrous sulfate 325 (65 FE) MG EC tablet Take 1 tablet (325 mg total) by mouth daily with breakfast. 90 tablet 3  . fexofenadine (ALLEGRA) 180 MG tablet Take 1 tablet (180 mg total) by mouth daily. 90 tablet 3  . fluticasone (FLONASE) 50 MCG/ACT nasal spray USE 2 SPRAYS IN EACH NOSTRIL ONCE A DAY 16 g 4  . hydrochlorothiazide (MICROZIDE) 12.5 MG capsule Take 1 capsule (12.5 mg total) by mouth daily. 30 capsule 0  . levothyroxine (SYNTHROID, LEVOTHROID) 112 MCG tablet Take 1 tablet (112 mcg total) by mouth daily before breakfast. 90 tablet 0   No current facility-administered medications for this  visit.    Allergies  Allergen Reactions  . Sulfa Antibiotics     Family History  Problem Relation Age of Onset  . Hypertension Mother   . COPD Mother   . Birth defects Maternal Aunt     uterine  . Stroke Paternal Uncle     History   Social History  . Marital Status: Married    Spouse Name: N/A    Number of Children: N/A  . Years of Education: N/A   Occupational History  . Not on file.   Social History Main Topics  . Smoking status: Never Smoker   . Smokeless tobacco: Never Used  . Alcohol Use: No  . Drug Use: No  . Sexual Activity: Yes    Birth Control/ Protection: Post-menopausal   Other Topics Concern  . Not on file   Social History Narrative     Constitutional: Pt reports fatigue. Denies fever, malaise,  headache or abrupt weight changes.  Respiratory: Denies difficulty breathing, shortness of breath, cough or sputum production.   Cardiovascular: Denies chest pain, chest tightness, palpitations or swelling in the hands or feet.  Neurological: Denies dizziness, difficulty with memory, difficulty with speech or problems with balance and coordination.   No other specific complaints in a complete review of systems (except as listed in HPI above).  Objective:   Physical Exam  BP 126/88 mmHg  Pulse 98  Temp(Src) 97.8 F (36.6 C) (Oral)  Wt 233 lb (105.688 kg)  SpO2 98% Wt Readings from Last 3 Encounters:  01/09/14 233 lb (105.688 kg)  12/05/13 234 lb (106.142 kg)  11/28/12 228 lb (103.42 kg)    General: Appears her stated age, obese but well developed, well nourished in NAD. Cardiovascular: Normal rate and rhythm. S1,S2 noted.  No murmur, rubs or gallops noted. No JVD or BLE edema.  Pulmonary/Chest: Normal effort and positive vesicular breath sounds. No respiratory distress. No wheezes, rales or ronchi noted.  Neurological: Alert and oriented.  BMET    Component Value Date/Time   NA 140 12/05/2013 1526   K 3.9 12/05/2013 1526   CL 104 12/05/2013  1526   CO2 24 12/05/2013 1526   GLUCOSE 97 12/05/2013 1526   BUN 11 12/05/2013 1526   CREATININE 0.99 12/05/2013 1526   CREATININE 0.9 11/28/2012 1123   CALCIUM 9.0 12/05/2013 1526    Lipid Panel     Component Value Date/Time   CHOL 176 12/05/2013 1526   TRIG 85 12/05/2013 1526   HDL 44 12/05/2013 1526   CHOLHDL 4.0 12/05/2013 1526   VLDL 17 12/05/2013 1526   LDLCALC 115* 12/05/2013 1526    CBC    Component Value Date/Time   WBC 7.5 12/05/2013 1526   RBC 4.79 12/05/2013 1526   HGB 15.1* 12/05/2013 1526   HCT 42.9 12/05/2013 1526   PLT 219 12/05/2013 1526   MCV 89.6 12/05/2013 1526   MCH 31.5 12/05/2013 1526   MCHC 35.2 12/05/2013 1526   RDW 14.0 12/05/2013 1526    Hgb A1C No results found for: HGBA1C       Assessment & Plan:   Elevated B12 level:  Will repeat b12 today If low, will restart B12 supplements for fatigue  RTC in 6 months for follow up chronic conditions

## 2014-01-09 NOTE — Progress Notes (Signed)
Pre visit review using our clinic review tool, if applicable. No additional management support is needed unless otherwise documented below in the visit note. 

## 2014-01-09 NOTE — Assessment & Plan Note (Signed)
TSH and Free T 4 reviewed Will refill synthroid today

## 2014-01-09 NOTE — Assessment & Plan Note (Signed)
Well controlled on HCTZ Will check CMET today

## 2014-01-09 NOTE — Addendum Note (Signed)
Addended by: Baldomero LamyHAVERS, Donita Newland C on: 01/09/2014 04:37 PM   Modules accepted: Kipp BroodSmartSet

## 2014-01-09 NOTE — Patient Instructions (Addendum)

## 2014-01-12 ENCOUNTER — Telehealth: Payer: Self-pay | Admitting: Internal Medicine

## 2014-01-12 NOTE — Telephone Encounter (Signed)
emmi emailed °

## 2014-03-23 ENCOUNTER — Telehealth: Payer: Self-pay | Admitting: Internal Medicine

## 2014-03-23 NOTE — Telephone Encounter (Signed)
TELEPHONE ADVICE RECORD [facility]  Patient Name: Molly Cummings  DOB: 1960/04/07    Initial Comment Caller states that she hasn't had a period in several year, now she is having dark brown discharge.    Nurse Assessment  Nurse: Odis LusterBowers, RN, Bjorn Loserhonda Date/Time (Eastern Time): 03/23/2014 3:25:45 PM  Confirm and document reason for call. If symptomatic, describe symptoms. ---Caller states that she hasn't had a period in several year, now she is having dark brown discharge. She began having sore breasts, pre period headache with cramps. Last Tuesday, she began to have the discharge, with some mild bright red bleeding. The dark discharge has stopped yesterday.  Has the patient traveled out of the country within the last 30 days? ---No  Does the patient require triage? ---Yes  Related visit to physician within the last 2 weeks? ---No  Does the PT have any chronic conditions? (i.e. diabetes, asthma, etc.) ---Yes  List chronic conditions. ---psoriatic arthritis  Did the patient indicate they were pregnant? ---No     Guidelines    Guideline Title Affirmed Question Affirmed Notes  Menstrual Period - Missed or Late Age > 40 years    Final Disposition User   See PCP within 2 Blima DessertWeeks Bowers, RN, Bjorn Loserhonda    Comments  Caller states missed the nurse's call. Plz try again.  Scheduled caller appt for 03/24/14 @ 9:00 am with Dr. Eugenio Hoesegina Beity. Caller voiced understanding.

## 2014-03-24 ENCOUNTER — Ambulatory Visit (INDEPENDENT_AMBULATORY_CARE_PROVIDER_SITE_OTHER): Payer: Private Health Insurance - Indemnity | Admitting: Internal Medicine

## 2014-03-24 ENCOUNTER — Encounter: Payer: Self-pay | Admitting: Internal Medicine

## 2014-03-24 ENCOUNTER — Other Ambulatory Visit (HOSPITAL_COMMUNITY)
Admission: RE | Admit: 2014-03-24 | Discharge: 2014-03-24 | Disposition: A | Discharge status: Home / Self Care | Payer: Private Health Insurance - Indemnity | Source: Ambulatory Visit | Attending: Internal Medicine | Admitting: Internal Medicine

## 2014-03-24 VITALS — BP 130/88 | HR 88 | Temp 97.9°F | Wt 234.0 lb

## 2014-03-24 DIAGNOSIS — N950 Postmenopausal bleeding: Secondary | ICD-10-CM

## 2014-03-24 DIAGNOSIS — Z01419 Encounter for gynecological examination (general) (routine) without abnormal findings: Secondary | ICD-10-CM | POA: Insufficient documentation

## 2014-03-24 DIAGNOSIS — Z1151 Encounter for screening for human papillomavirus (HPV): Secondary | ICD-10-CM | POA: Diagnosis present

## 2014-03-24 DIAGNOSIS — E538 Deficiency of other specified B group vitamins: Secondary | ICD-10-CM

## 2014-03-24 MED ORDER — CYANOCOBALAMIN 1000 MCG/ML IJ SOLN
1000.0000 ug | Freq: Once | INTRAMUSCULAR | Status: AC
  Administered 2014-03-24: 1000 ug via INTRAMUSCULAR

## 2014-03-24 NOTE — Progress Notes (Signed)
Pre visit review using our clinic review tool, if applicable. No additional management support is needed unless otherwise documented below in the visit note. 

## 2014-03-24 NOTE — Patient Instructions (Signed)
Postmenopausal Bleeding  Postmenopausal bleeding is any bleeding a woman has after she has entered into menopause. Menopause is the end of a woman's fertile years. After menopause, a woman no longer ovulates or has menstrual periods.   Postmenopausal bleeding can be caused by various things. Any type of postmenopausal bleeding, even if it appears to be a typical menstrual period, is concerning. This should be evaluated by your health care provider. Any treatment will depend on the cause of the bleeding.  HOME CARE INSTRUCTIONS  Monitor your condition for any changes. The following actions may help to alleviate any discomfort you are experiencing:  · Avoid the use of tampons and douches as directed by your health care provider.   · Change your pads frequently.  · Get regular pelvic exams and Pap tests.  · Keep all follow-up appointments for diagnostic tests as directed by your health care provider.  SEEK MEDICAL CARE IF:   · Your bleeding lasts more than 1 week.  · You have abdominal pain.  · You have bleeding with sexual intercourse.  SEEK IMMEDIATE MEDICAL CARE IF:   · You have a fever, chills, headache, dizziness, muscle aches, and bleeding.  · You have severe pain with bleeding.  · You are passing blood clots.  · You have bleeding and need more than 1 pad an hour.  · You feel faint.  MAKE SURE YOU:  · Understand these instructions.  · Will watch your condition.  · Will get help right away if you are not doing well or get worse.  Document Released: 04/19/2005 Document Revised: 10/30/2012 Document Reviewed: 08/08/2012  [employer]® Patient Information ©2015 [employer]. This information is not intended to replace advice given to you by your health care provider. Make sure you discuss any questions you have with your health care provider.

## 2014-03-24 NOTE — Progress Notes (Signed)
Subjective:    Patient ID: Molly Cummings, female    DOB: [birth year], 54 y.o.   MRN: [id number]  HPI  Pt presents to the clinic today with c/o vaginal bleeding. She reports this started 1 week ago. She has also had tender breast and cramps in her pelvic area. She reports she noticed thick, dark red spotting x 2-3 days. She has not noticed any bleeding or spotting since yesterday. She has not had a menstrual cycle in the last 3 years. Her last pap was in 2013 and was normal. She is sexually active with 1 partner. She has no family history of breast, uterine, ovarian or cervical cancer.   Review of Systems      Past Medical History  Diagnosis Date  . Arthritis   . GERD (gastroesophageal reflux disease)   . Thyroid disease     Current Outpatient Prescriptions  Medication Sig Dispense Refill  . cyanocobalamin (,VITAMIN B-12,) 1000 MCG/ML injection Inject 1 mL (1,000 mcg total) into the muscle once a week. 30 mL 1  . Cyanocobalamin (B-12) 1000 MCG CAPS Take 1 capsule by mouth daily. 90 capsule 3  . ergocalciferol (VITAMIN D2) 50000 UNITS capsule Take 1 capsule (50,000 Units total) by mouth 2 (two) times a week. 4 capsule 4  . etanercept (ENBREL) 50 MG/ML injection Inject 50 mg into the skin once a week.    . ferrous sulfate 325 (65 FE) MG EC tablet Take 1 tablet (325 mg total) by mouth daily with breakfast. 90 tablet 3  . fexofenadine (ALLEGRA) 180 MG tablet Take 1 tablet (180 mg total) by mouth daily. 90 tablet 3  . fluticasone (FLONASE) 50 MCG/ACT nasal spray USE 2 SPRAYS IN EACH NOSTRIL ONCE A DAY 16 g 4  . hydrochlorothiazide (MICROZIDE) 12.5 MG capsule Take 1 capsule (12.5 mg total) by mouth daily. 90 capsule 1  . levothyroxine (SYNTHROID, LEVOTHROID) 112 MCG tablet Take 1 tablet (112 mcg total) by mouth daily before breakfast. 90 tablet 1   No current facility-administered medications for this visit.    Allergies  Allergen Reactions  . Sulfa Antibiotics     Family History    Problem Relation Age of Onset  . Hypertension Mother   . COPD Mother   . Birth defects Maternal Aunt     uterine  . Stroke Paternal Uncle     History   Social History  . Marital Status: Married    Spouse Name: N/A  . Number of Children: N/A  . Years of Education: N/A   Occupational History  . Not on file.   Social History Main Topics  . Smoking status: Never Smoker   . Smokeless tobacco: Never Used  . Alcohol Use: No  . Drug Use: No  . Sexual Activity: Yes    Birth Control/ Protection: Post-menopausal   Other Topics Concern  . Not on file   Social History Narrative     Constitutional: Denies fever, malaise, fatigue, headache or abrupt weight changes.  Respiratory: Denies difficulty breathing, shortness of breath, cough or sputum production.   Cardiovascular: Denies chest pain, chest tightness, palpitations or swelling in the hands or feet.  Gastrointestinal: Pt reports pelvic cramps. Denies bloating, constipation, diarrhea or blood in the stool.  GU: Pt reports vaginal bleeding. Denies urgency, frequency, pain with urination, burning sensation, blood in urine, odor. Musculoskeletal: Pt report breast tenderness. Denies decrease in range of motion, difficulty with gait, muscle pain or joint pain and swelling.   No other  specific complaints in a complete review of systems (except as listed in HPI above).  Objective:   Physical Exam BP 130/88 mmHg  Pulse 88  Temp(Src) 97.9 F (36.6 C) (Oral)  Wt 234 lb (106.142 kg)  SpO2 98% Wt Readings from Last 3 Encounters:  03/24/14 234 lb (106.142 kg)  01/09/14 233 lb (105.688 kg)  12/05/13 234 lb (106.142 kg)    General: Appears her stated age, obese in NAD. Skin: Warm, dry and intact.  Cardiovascular: Normal rate and rhythm. S1,S2 noted.  No murmur, rubs or gallops noted.  Pulmonary/Chest: Normal effort and positive vesicular breath sounds. No respiratory distress. No wheezes, rales or ronchi noted.  Abdomen: Soft and  nontender. Normal bowel sounds, no bruits noted. No distention or masses noted. Liver, spleen and kidneys non palpable. Pelvic: Normal female anatomy. Cervix without discharge, not friable. Small amount of brown discharge noted below the cervix. No bleeding noted. No CMT. Adnexa non palpable. Neurological: Alert and oriented.   BMET    Component Value Date/Time   NA 140 01/09/2014 1558   K 3.6 01/09/2014 1558   CL 102 01/09/2014 1558   CO2 28 01/09/2014 1558   GLUCOSE 110* 01/09/2014 1558   BUN 12 01/09/2014 1558   CREATININE 0.99 01/09/2014 1558   CREATININE 0.9 11/28/2012 1123   CALCIUM 9.4 01/09/2014 1558    Lipid Panel     Component Value Date/Time   CHOL 176 12/05/2013 1526   TRIG 85 12/05/2013 1526   HDL 44 12/05/2013 1526   CHOLHDL 4.0 12/05/2013 1526   VLDL 17 12/05/2013 1526   LDLCALC 115* 12/05/2013 1526    CBC    Component Value Date/Time   WBC 7.5 12/05/2013 1526   RBC 4.79 12/05/2013 1526   HGB 15.1* 12/05/2013 1526   HCT 42.9 12/05/2013 1526   PLT 219 12/05/2013 1526   MCV 89.6 12/05/2013 1526   MCH 31.5 12/05/2013 1526   MCHC 35.2 12/05/2013 1526   RDW 14.0 12/05/2013 1526    Hgb A1C No results found for: HGBA1C        Assessment & Plan:   Postmenopausal Bleeding:  ? If r/t surge of hormones Pap done today If pap normal, ok to monitor for now If pap abnormal or bleeding reoccurs, will refer to gyn for endometrial biopsy  Will follow up with you after labs come back, RTC in 5 months to follow up HTN, Hypothyroidism

## 2014-03-27 LAB — CYTOLOGY - PAP

## 2014-03-30 ENCOUNTER — Encounter: Payer: Self-pay | Admitting: *Deleted

## 2014-06-16 ENCOUNTER — Other Ambulatory Visit: Payer: Self-pay

## 2014-06-16 DIAGNOSIS — I10 Essential (primary) hypertension: Secondary | ICD-10-CM

## 2014-06-16 MED ORDER — HYDROCHLOROTHIAZIDE 12.5 MG PO CAPS: 12.5000 mg | ORAL_CAPSULE | Freq: Every day | ORAL | Status: DC

## 2014-06-16 MED ORDER — LEVOTHYROXINE SODIUM 112 MCG PO TABS: 112.0000 ug | ORAL_TABLET | Freq: Every day | ORAL | Status: DC

## 2014-06-16 NOTE — Telephone Encounter (Signed)
[facility] left v/m requesting refill HCTZ and levothyroxine; refill done # 90 x 0 for each med per protocol. Pt was seen 03/24/14 and has f/u appt for 08/24/2014 with Nicki Reaperegina Baity NP.

## 2014-07-17 ENCOUNTER — Ambulatory Visit: Payer: Private Health Insurance - Indemnity | Admitting: Internal Medicine

## 2014-08-24 ENCOUNTER — Encounter (INDEPENDENT_AMBULATORY_CARE_PROVIDER_SITE_OTHER): Payer: Self-pay

## 2014-08-24 ENCOUNTER — Encounter: Payer: Self-pay | Admitting: Internal Medicine

## 2014-08-24 ENCOUNTER — Ambulatory Visit (INDEPENDENT_AMBULATORY_CARE_PROVIDER_SITE_OTHER): Payer: Managed Care, Other (non HMO) | Admitting: Internal Medicine

## 2014-08-24 VITALS — BP 126/88 | HR 81 | Temp 97.8°F | Wt 239.0 lb

## 2014-08-24 DIAGNOSIS — E669 Obesity, unspecified: Secondary | ICD-10-CM

## 2014-08-24 DIAGNOSIS — E538 Deficiency of other specified B group vitamins: Secondary | ICD-10-CM

## 2014-08-24 DIAGNOSIS — L4050 Arthropathic psoriasis, unspecified: Secondary | ICD-10-CM | POA: Diagnosis not present

## 2014-08-24 DIAGNOSIS — H8110 Benign paroxysmal vertigo, unspecified ear: Secondary | ICD-10-CM

## 2014-08-24 DIAGNOSIS — E039 Hypothyroidism, unspecified: Secondary | ICD-10-CM

## 2014-08-24 DIAGNOSIS — Z23 Encounter for immunization: Secondary | ICD-10-CM | POA: Diagnosis not present

## 2014-08-24 DIAGNOSIS — I10 Essential (primary) hypertension: Secondary | ICD-10-CM | POA: Diagnosis not present

## 2014-08-24 DIAGNOSIS — L821 Other seborrheic keratosis: Secondary | ICD-10-CM

## 2014-08-24 LAB — T4, FREE: Free T4: 1.07 ng/dL (ref 0.60–1.60)

## 2014-08-24 LAB — COMPREHENSIVE METABOLIC PANEL
ALBUMIN: 3.8 g/dL (ref 3.5–5.2)
ALT: 28 U/L (ref 0–35)
AST: 22 U/L (ref 0–37)
Alkaline Phosphatase: 77 U/L (ref 39–117)
BUN: 11 mg/dL (ref 6–23)
CO2: 30 mEq/L (ref 19–32)
CREATININE: 0.88 mg/dL (ref 0.40–1.20)
Calcium: 9.5 mg/dL (ref 8.4–10.5)
Chloride: 104 mEq/L (ref 96–112)
GFR: 71.35 mL/min (ref >60.00)
Glucose, Bld: 62 mg/dL — ABNORMAL LOW (ref 70–99)
Potassium: 3.8 mEq/L (ref 3.5–5.1)
Sodium: 141 mEq/L (ref 135–145)
Total Bilirubin: 0.2 mg/dL (ref 0.2–1.2)
Total Protein: 7.8 g/dL (ref 6.0–8.3)

## 2014-08-24 LAB — CBC
HEMATOCRIT: 46.4 % — AB (ref 36.0–46.0)
Hemoglobin: 15.5 g/dL — ABNORMAL HIGH (ref 12.0–15.0)
MCHC: 33.5 g/dL (ref 30.0–36.0)
MCV: 95.6 fl (ref 78.0–100.0)
PLATELETS: 224 10*3/uL (ref 150.0–400.0)
RBC: 4.85 Mil/uL (ref 3.87–5.11)
RDW: 14.3 % (ref 11.5–15.5)
WBC: 7.8 10*3/uL (ref 4.0–10.5)

## 2014-08-24 LAB — TSH: TSH: 3.03 u[IU]/mL (ref 0.35–4.50)

## 2014-08-24 LAB — VITAMIN B12: Vitamin B-12: 227 pg/mL (ref 211–911)

## 2014-08-24 MED ORDER — MECLIZINE HCL 50 MG PO TABS: 25.0000 mg | ORAL_TABLET | Freq: Three times a day (TID) | ORAL | Status: AC | PRN

## 2014-08-24 NOTE — Assessment & Plan Note (Signed)
More fatigue Will repeat B12 today If low, will resume monthly injections

## 2014-08-24 NOTE — Assessment & Plan Note (Signed)
She has had some fatigue and dizziness Will check TSH and Free T4 today Will adjust dose depending on labs

## 2014-08-24 NOTE — Progress Notes (Signed)
Pre visit review using our clinic review tool, if applicable. No additional management support is needed unless otherwise documented below in the visit note. 

## 2014-08-24 NOTE — Assessment & Plan Note (Addendum)
Skin lesion on right cheek c/w psoriatic arthritis Will check CBC today Continue Enbrel Continue to follow with rheumatology

## 2014-08-24 NOTE — Patient Instructions (Signed)

## 2014-08-24 NOTE — Assessment & Plan Note (Signed)
BP well controlled on HCTZ CBC and CMET today ECG today normal

## 2014-08-24 NOTE — Assessment & Plan Note (Signed)
Encouraged her to work on diet and exercise 

## 2014-08-24 NOTE — Progress Notes (Signed)
Subjective:    Patient ID: Molly Cummings, female    DOB: Apr 19, 1960, 54 y.o.   MRN: [id number]  HPI  Pt presents to the clinic today for 6 month follow up of chronic conditions.  HTN: BP well controlled on HCTZ. She denies chest pain, shortness of breath or swelling in her feet. Her BP today is 126/88.   Hypothyroidism: She has been feeling more fatigued but not sure if it is related to her thyroid. She denies any issues on her current dose of Synthroid.  Obesity: She has gained 6 pounds since her last visit. Her BMI today is 39.48. She does not exercise.  Psoriatic Arthritis: Controlled on Enbrel. She last saw rheumatology 2 months ago. She does have 2 plaques that she would like looked at. One is on the right side of her face. One is on her left abdomen.  B12 deficiency: She would like to restart her B12 injections. Her last B12 was 352 in 12/2013. She has felt fatigued and dizzy at time. It has been worse over the last month. The dizziness seems worse when she is laying in bed and rolls over the the right. She will fell like the room is spinning and she will get nauseated. Her symptoms do not last long. She has had vertigo in the past and reports this feels the same.  Review of Systems      Past Medical History  Diagnosis Date  . Arthritis   . GERD (gastroesophageal reflux disease)   . Thyroid disease     Current Outpatient Prescriptions  Medication Sig Dispense Refill  . cyanocobalamin (,VITAMIN B-12,) 1000 MCG/ML injection Inject 1 mL (1,000 mcg total) into the muscle once a week. (Patient taking differently: Inject 1,000 mcg into the muscle every 30 (thirty) days. ) 30 mL 1  . Cyanocobalamin (B-12) 1000 MCG CAPS Take 1 capsule by mouth daily. 90 capsule 3  . ergocalciferol (VITAMIN D2) 50000 UNITS capsule Take 1 capsule (50,000 Units total) by mouth 2 (two) times a week. 4 capsule 4  . etanercept (ENBREL) 50 MG/ML injection Inject 50 mg into the skin once a week.    .  ferrous sulfate 325 (65 FE) MG EC tablet Take 1 tablet (325 mg total) by mouth daily with breakfast. 90 tablet 3  . fexofenadine (ALLEGRA) 180 MG tablet Take 1 tablet (180 mg total) by mouth daily. 90 tablet 3  . fluticasone (FLONASE) 50 MCG/ACT nasal spray USE 2 SPRAYS IN EACH NOSTRIL ONCE A DAY 16 g 4  . hydrochlorothiazide (MICROZIDE) 12.5 MG capsule Take 1 capsule (12.5 mg total) by mouth daily. 90 capsule 0  . levothyroxine (SYNTHROID, LEVOTHROID) 112 MCG tablet Take 1 tablet (112 mcg total) by mouth daily before breakfast. 90 tablet 0   No current facility-administered medications for this visit.    Allergies  Allergen Reactions  . Sulfa Antibiotics     Family History  Problem Relation Age of Onset  . Hypertension Mother   . COPD Mother   . Birth defects Maternal Aunt     uterine  . Stroke Paternal Uncle     History   Social History  . Marital Status: Married    Spouse Name: N/A  . Number of Children: N/A  . Years of Education: N/A   Occupational History  . Not on file.   Social History Main Topics  . Smoking status: Never Smoker   . Smokeless tobacco: Never Used  . Alcohol Use: No  .  Drug Use: No  . Sexual Activity: Yes    Birth Control/ Protection: Post-menopausal   Other Topics Concern  . Not on file   Social History Narrative     Constitutional: Pt reports fatigue. Denies fever, malaise, headache or abrupt weight changes.  HEENT: Denies eye pain, eye redness, ear pain, ringing in the ears, wax buildup, runny nose, nasal congestion, bloody nose, or sore throat. Respiratory: Denies difficulty breathing, shortness of breath, cough or sputum production.   Cardiovascular: Denies chest pain, chest tightness, palpitations or swelling in the hands or feet.  Gastrointestinal: Denies abdominal pain, bloating, constipation, diarrhea or blood in the stool.  GU: Denies urgency, frequency, pain with urination, burning sensation, blood in urine, odor or  discharge. Musculoskeletal: Denies decrease in range of motion, difficulty with gait, muscle pain or joint pain and swelling.  Skin: Pt reports abnormal skin lesions. Denies or ulcercations.  Neurological: Pt reports dizziness. Denies difficulty with memory, difficulty with speech or problems with balance and coordination.  Psych: Denies anxiety, depression, SI/HI.  No other specific complaints in a complete review of systems (except as listed in HPI above).  Objective:   Physical Exam   BP 126/88 mmHg  Pulse 81  Temp(Src) 97.8 F (36.6 C) (Oral)  Wt 239 lb (108.41 kg)  SpO2 98% Wt Readings from Last 3 Encounters:  08/24/14 239 lb (108.41 kg)  03/24/14 234 lb (106.142 kg)  01/09/14 233 lb (105.688 kg)    General: Appears her stated age, obese in NAD. Skin: Warm, dry and intact. 1 cm oval plaque noted on right upper cheek. She has an oval seborrheic keratosis noted on left abdomen. HEENT: Head: normal shape and size; Eyes: sclera white, no icterus, conjunctiva pink, PERRLA and EOMs intact;  Neck: Neck supple, trachea midline. No masses, lumps or thyromegaly present.  Cardiovascular: Normal rate and rhythm. S1,S2 noted.  No murmur, rubs or gallops noted. No JVD or BLE edema.  Pulmonary/Chest: Normal effort and positive vesicular breath sounds. No respiratory distress. No wheezes, rales or ronchi noted.  Neurological: Alert and oriented. Coordination normal. Psychiatric: Mood and affect normal. Behavior is normal. Judgment and thought content normal.    BMET    Component Value Date/Time   NA 140 01/09/2014 1558   K 3.6 01/09/2014 1558   CL 102 01/09/2014 1558   CO2 28 01/09/2014 1558   GLUCOSE 110* 01/09/2014 1558   BUN 12 01/09/2014 1558   CREATININE 0.99 01/09/2014 1558   CREATININE 0.9 11/28/2012 1123   CALCIUM 9.4 01/09/2014 1558    Lipid Panel     Component Value Date/Time   CHOL 176 12/05/2013 1526   TRIG 85 12/05/2013 1526   HDL 44 12/05/2013 1526   CHOLHDL  4.0 12/05/2013 1526   VLDL 17 12/05/2013 1526   LDLCALC 115* 12/05/2013 1526    CBC    Component Value Date/Time   WBC 7.5 12/05/2013 1526   RBC 4.79 12/05/2013 1526   HGB 15.1* 12/05/2013 1526   HCT 42.9 12/05/2013 1526   PLT 219 12/05/2013 1526   MCV 89.6 12/05/2013 1526   MCH 31.5 12/05/2013 1526   MCHC 35.2 12/05/2013 1526   RDW 14.0 12/05/2013 1526    Hgb A1C No results found for: HGBA1C      Assessment & Plan:   Skin lesion of abdomen:  Looks like SK No intervention needed at this time Will make an appt for me to do a shave biopsy  BPPV:  eRx for Meclizine 25  mg TID prn If no improvement, will refer to vestibular rehab  RTC in 6 months for annual exam/6 month follow up of chronic conditions

## 2014-08-24 NOTE — Addendum Note (Signed)
Addended by: Roena Malady on: 08/24/2014 01:48 PM   Modules accepted: Orders

## 2014-08-26 ENCOUNTER — Other Ambulatory Visit: Payer: Managed Care, Other (non HMO)

## 2014-08-29 ENCOUNTER — Other Ambulatory Visit: Payer: Self-pay | Admitting: Internal Medicine

## 2014-11-21 ENCOUNTER — Other Ambulatory Visit: Payer: Self-pay | Admitting: Internal Medicine

## 2014-12-24 ENCOUNTER — Ambulatory Visit: Payer: Managed Care, Other (non HMO) | Admitting: Internal Medicine

## 2015-01-04 ENCOUNTER — Encounter: Payer: Managed Care, Other (non HMO) | Admitting: Internal Medicine

## 2015-03-01 ENCOUNTER — Encounter: Payer: Managed Care, Other (non HMO) | Admitting: Internal Medicine

## 2015-03-01 ENCOUNTER — Other Ambulatory Visit: Payer: Self-pay

## 2015-03-01 MED ORDER — LEVOTHYROXINE SODIUM 112 MCG PO TABS: ORAL_TABLET | ORAL | Status: AC

## 2015-03-01 MED ORDER — HYDROCHLOROTHIAZIDE 12.5 MG PO CAPS: ORAL_CAPSULE | ORAL | Status: AC

## 2015-03-01 NOTE — Telephone Encounter (Signed)
Pt left v/m requesting 90 day refill HCTZ and levothyroxine to [facility]. Pt last seen 08/24/2014 and pt scheduled for CPX on 03/18/15.advised refill done per protocol. Pt voiced understanding.

## 2015-03-18 ENCOUNTER — Encounter: Payer: 59 | Admitting: Internal Medicine

## 2016-08-17 IMAGING — CR DG LUMBAR SPINE COMPLETE 4+V
5 series · 5 of 5 positions shown · non-contrast
Comparison: None.

CLINICAL DATA: Low back pain.  No trauma.  Initial evaluation .

EXAM:
LUMBAR SPINE - COMPLETE 4+ VIEW

[view not recorded (1 of 5)]
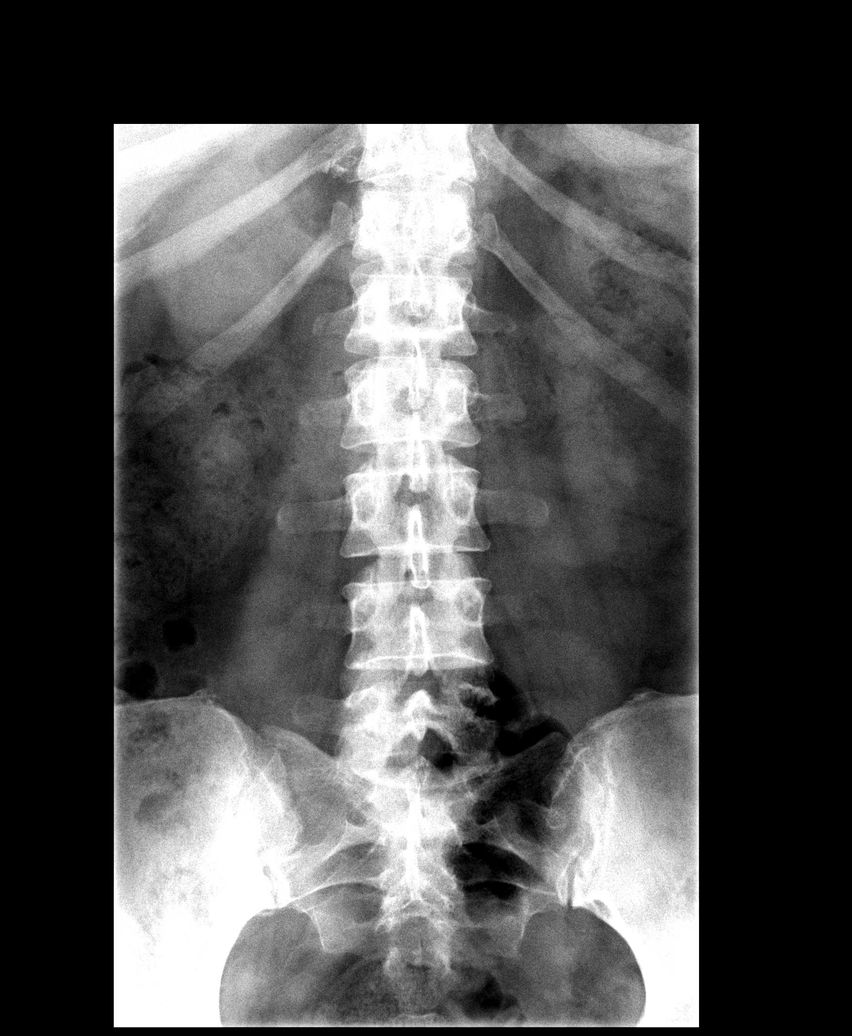

[view not recorded (2 of 5)]
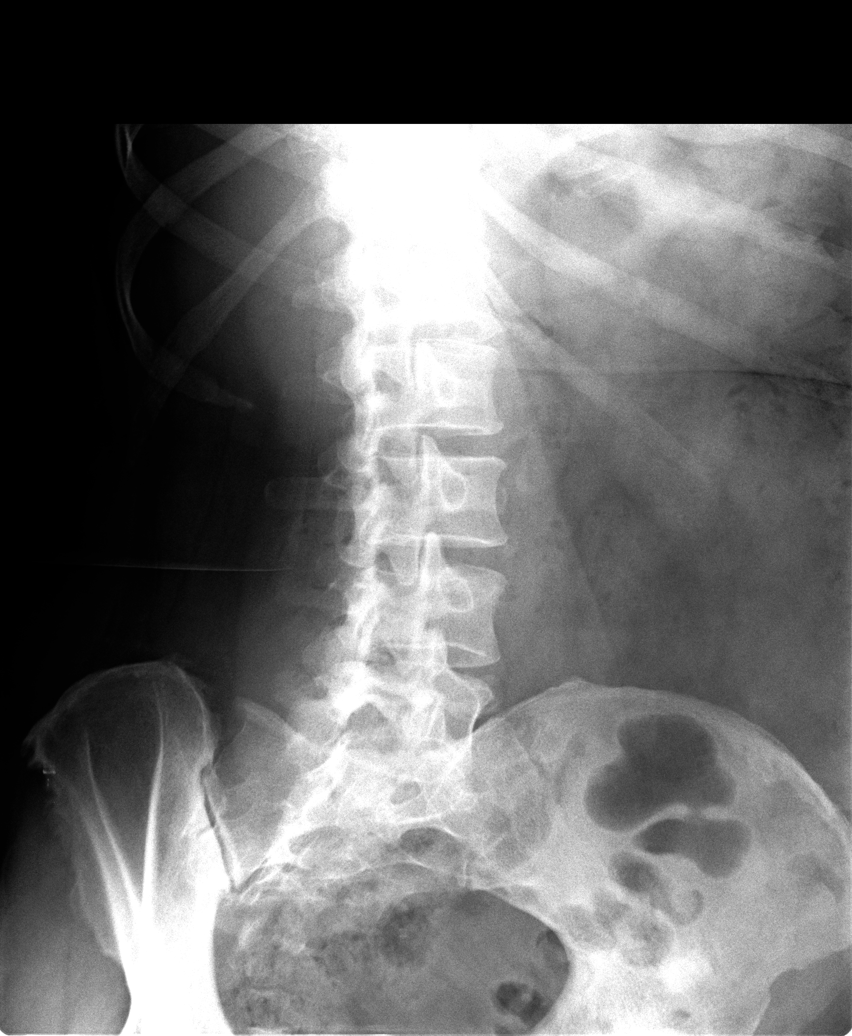

[view not recorded (3 of 5)]
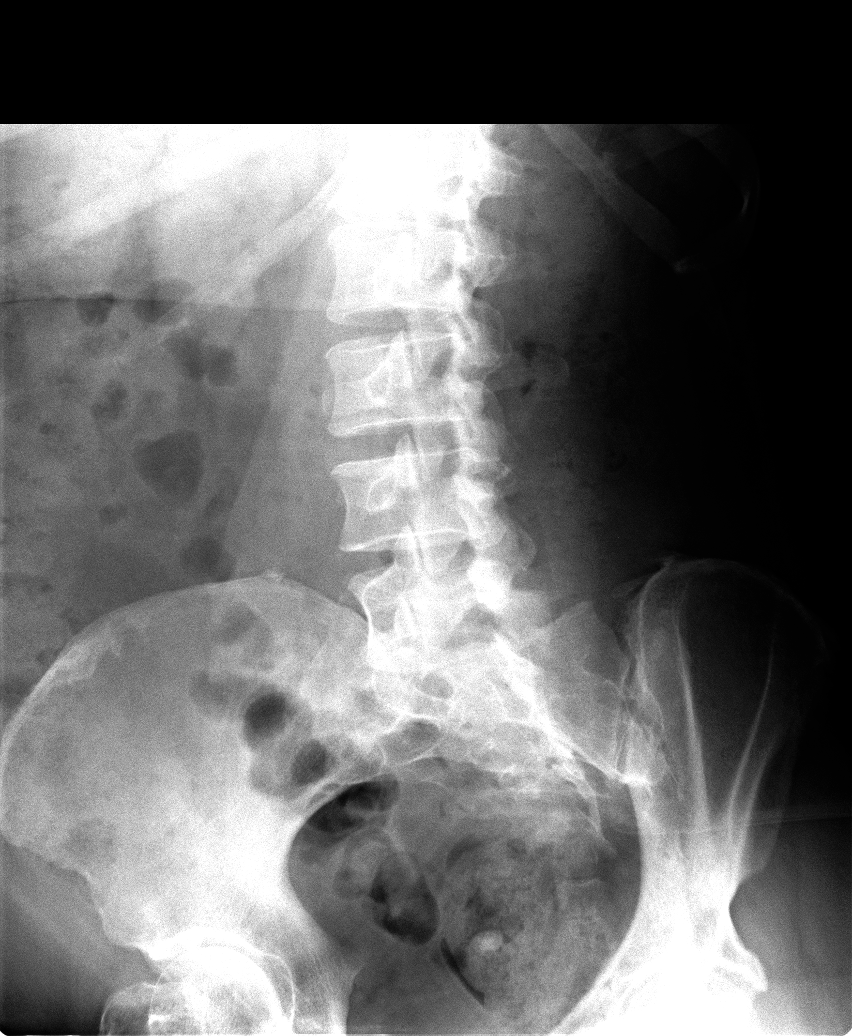

[view not recorded (4 of 5)]
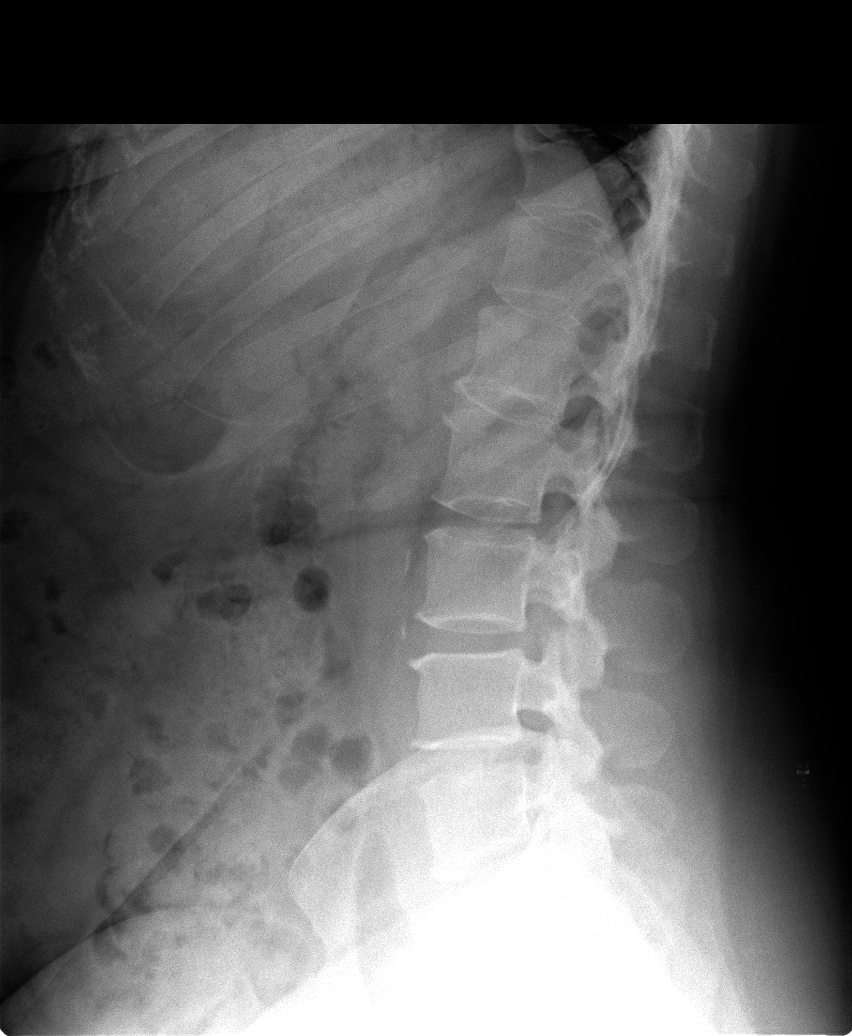

[view not recorded (5 of 5)]
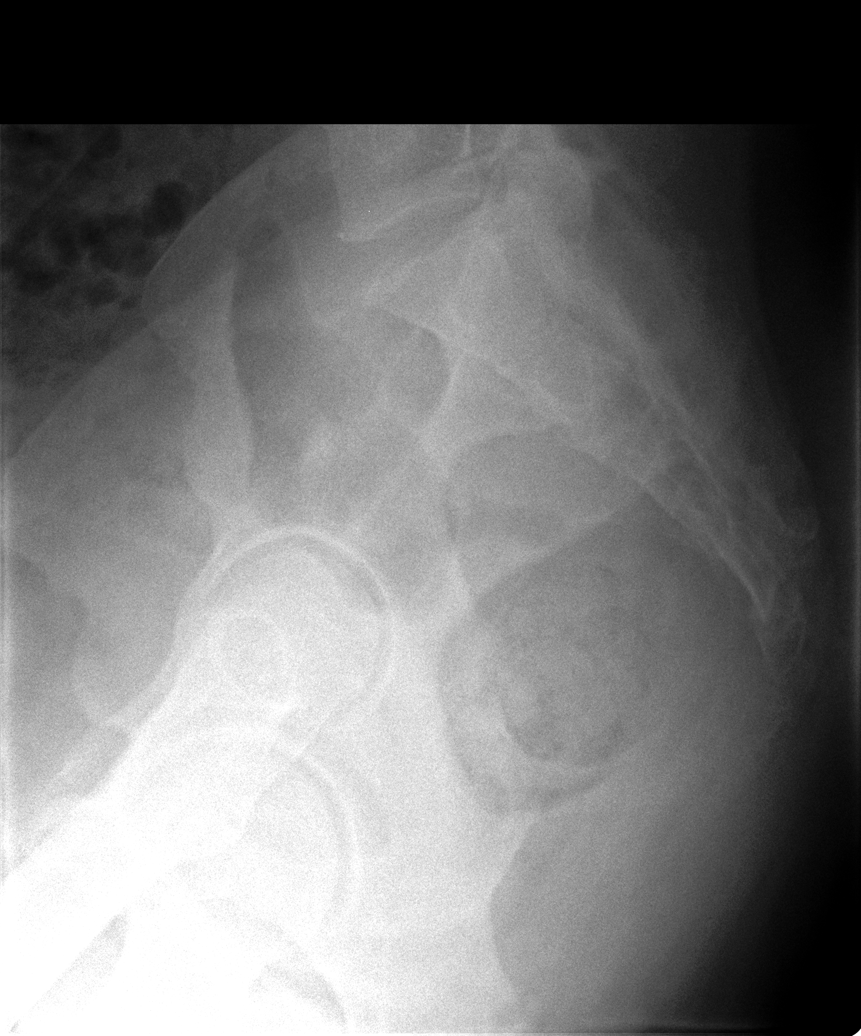

[5 of 5 positions shown; findings below may reference images not displayed]

FINDINGS: Diffuse multilevel mild degenerative disc disease with endplate
osteophyte formation disc space loss at L5-S1 noted. Normal
alignment mineralization. No acute abnormality. Mild aortic
atherosclerotic vascular calcification.
IMPRESSION: 1. Mild diffuse degenerative change lumbar spine. No acute
abnormality.

2.  Mild aortic atherosclerotic vascular calcification.
